# Patient Record
Sex: Male | Born: 1985 | Hispanic: Yes | Marital: Single
Health system: Southern US, Community
[De-identification: ages and names within clinical notes are randomized; demographics above are authoritative.]

## PROBLEM LIST (undated history)

## (undated) DIAGNOSIS — S069XAA Unspecified intracranial injury with loss of consciousness status unknown, initial encounter: Secondary | ICD-10-CM

## (undated) DIAGNOSIS — S069X9A Unspecified intracranial injury with loss of consciousness of unspecified duration, initial encounter: Secondary | ICD-10-CM

---

## 2016-05-17 ENCOUNTER — Emergency Department (HOSPITAL_COMMUNITY): Payer: Non-veteran care

## 2016-05-17 ENCOUNTER — Encounter (HOSPITAL_COMMUNITY): Payer: Self-pay | Admitting: Emergency Medicine

## 2016-05-17 ENCOUNTER — Emergency Department (HOSPITAL_COMMUNITY)
Admission: EM | Admit: 2016-05-17 | Discharge: 2016-05-17 | Disposition: A | Discharge status: Home / Self Care | Payer: Non-veteran care | Attending: Emergency Medicine | Admitting: Emergency Medicine

## 2016-05-17 DIAGNOSIS — T402X5A Adverse effect of other opioids, initial encounter: Secondary | ICD-10-CM | POA: Diagnosis not present

## 2016-05-17 DIAGNOSIS — R4182 Altered mental status, unspecified: Secondary | ICD-10-CM | POA: Insufficient documentation

## 2016-05-17 DIAGNOSIS — Y9301 Activity, walking, marching and hiking: Secondary | ICD-10-CM | POA: Diagnosis not present

## 2016-05-17 DIAGNOSIS — M545 Low back pain, unspecified: Secondary | ICD-10-CM

## 2016-05-17 DIAGNOSIS — Y9289 Other specified places as the place of occurrence of the external cause: Secondary | ICD-10-CM | POA: Diagnosis not present

## 2016-05-17 DIAGNOSIS — W19XXXA Unspecified fall, initial encounter: Secondary | ICD-10-CM | POA: Insufficient documentation

## 2016-05-17 DIAGNOSIS — Y999 Unspecified external cause status: Secondary | ICD-10-CM | POA: Insufficient documentation

## 2016-05-17 DIAGNOSIS — Z5181 Encounter for therapeutic drug level monitoring: Secondary | ICD-10-CM | POA: Insufficient documentation

## 2016-05-17 DIAGNOSIS — R531 Weakness: Secondary | ICD-10-CM | POA: Diagnosis present

## 2016-05-17 DIAGNOSIS — T50905A Adverse effect of unspecified drugs, medicaments and biological substances, initial encounter: Secondary | ICD-10-CM

## 2016-05-17 HISTORY — DX: Unspecified intracranial injury with loss of consciousness status unknown, initial encounter: S06.9XAA

## 2016-05-17 HISTORY — DX: Unspecified intracranial injury with loss of consciousness of unspecified duration, initial encounter: S06.9X9A

## 2016-05-17 LAB — ABO/RH: ABO/RH(D): O POS

## 2016-05-17 LAB — CBC WITH DIFFERENTIAL/PLATELET
Basophils Absolute: 0 10*3/uL (ref 0.0–0.1)
Basophils Relative: 0 %
EOS PCT: 0 %
Eosinophils Absolute: 0 10*3/uL (ref 0.0–0.7)
HCT: 42.2 % (ref 39.0–52.0)
Hemoglobin: 14.5 g/dL (ref 13.0–17.0)
LYMPHS ABS: 2 10*3/uL (ref 0.7–4.0)
LYMPHS PCT: 20 %
MCH: 30.9 pg (ref 26.0–34.0)
MCHC: 34.4 g/dL (ref 30.0–36.0)
MCV: 89.8 fL (ref 78.0–100.0)
MONOS PCT: 10 %
Monocytes Absolute: 1 10*3/uL (ref 0.1–1.0)
Neutro Abs: 6.9 10*3/uL (ref 1.7–7.7)
Neutrophils Relative %: 70 %
PLATELETS: 249 10*3/uL (ref 150–400)
RBC: 4.7 MIL/uL (ref 4.22–5.81)
RDW: 13 % (ref 11.5–15.5)
WBC: 9.9 10*3/uL (ref 4.0–10.5)

## 2016-05-17 LAB — COMPREHENSIVE METABOLIC PANEL
ALBUMIN: 3.9 g/dL (ref 3.5–5.0)
ALT: 25 U/L (ref 17–63)
AST: 44 U/L — AB (ref 15–41)
Alkaline Phosphatase: 50 U/L (ref 38–126)
Anion gap: 7 (ref 5–15)
BUN: 8 mg/dL (ref 6–20)
CHLORIDE: 104 mmol/L (ref 101–111)
CO2: 30 mmol/L (ref 22–32)
CREATININE: 0.87 mg/dL (ref 0.61–1.24)
Calcium: 9 mg/dL (ref 8.9–10.3)
GFR calc Af Amer: >60 mL/min (ref >60)
GLUCOSE: 92 mg/dL (ref 65–99)
POTASSIUM: 4.2 mmol/L (ref 3.5–5.1)
SODIUM: 141 mmol/L (ref 135–145)
Total Bilirubin: 0.8 mg/dL (ref 0.3–1.2)
Total Protein: 6.8 g/dL (ref 6.5–8.1)

## 2016-05-17 LAB — I-STAT ARTERIAL BLOOD GAS, ED
ACID-BASE EXCESS: 3 mmol/L — AB (ref 0.0–2.0)
BICARBONATE: 28.3 mmol/L — AB (ref 20.0–28.0)
O2 SAT: 97 %
PH ART: 7.418 (ref 7.350–7.450)
PO2 ART: 88 mmHg (ref 83.0–108.0)
Patient temperature: 98.6
TCO2: 30 mmol/L (ref 0–100)
pCO2 arterial: 43.8 mmHg (ref 32.0–48.0)

## 2016-05-17 LAB — AMMONIA: Ammonia: 40 umol/L — ABNORMAL HIGH (ref 9–35)

## 2016-05-17 LAB — ETHANOL

## 2016-05-17 LAB — TYPE AND SCREEN
ABO/RH(D): O POS
ANTIBODY SCREEN: NEGATIVE

## 2016-05-17 LAB — CBG MONITORING, ED: GLUCOSE-CAPILLARY: 99 mg/dL (ref 65–99)

## 2016-05-17 LAB — APTT: APTT: 33 s (ref 24–36)

## 2016-05-17 LAB — PROTIME-INR
INR: 0.97
Prothrombin Time: 12.9 seconds (ref 11.4–15.2)

## 2016-05-17 MED ORDER — SODIUM CHLORIDE 0.9 % IV SOLN
INTRAVENOUS | Status: DC
  Administered 2016-05-17: 16:00:00 via INTRAVENOUS

## 2016-05-17 MED ORDER — NALOXONE HCL 0.4 MG/ML IJ SOLN
0.4000 mg | INTRAMUSCULAR | Status: DC | PRN
  Administered 2016-05-17: 0.4 mg via INTRAVENOUS
  Filled 2016-05-17: qty 1

## 2016-05-17 NOTE — ED Triage Notes (Signed)
Pt arrives via ems [place] war vet with TBI,  per them he woke up on floor because he was sleep walking and now his legs are weak, arrives wants out of c colller and head blocks , he took them off before Josh could get in here,  Became upset and stood up unsteady on feet and stiff trying to walk, states vomited a lot last night.  unk when it started

## 2016-05-17 NOTE — ED Notes (Signed)
Pt states he needs crutches, ortho contacted because we are out of his size

## 2016-05-17 NOTE — ED Notes (Addendum)
Tried to talk pt into staying he is very unsteady on feet and sleepy, he wants to go out and smoke and then do brain xray offered a nicotine patch and he  Refused,  Security called, pt put on shoes and tried to walk again very unsteady, stating he wants to leave,, asked if his mother was coming since she called while he was here, pt states she lives out of state. Pt  Got in w/c which was offered and rolled out door by security, I had told him I could go out with him so he could smoke and that if he wanted to leave he could, he states no one can hold him and he wants to leave, witnessed by pa  Josh today.

## 2016-05-17 NOTE — Discharge Instructions (Signed)
Laboratory tests and x-rays were reassuring.Follow-up with your primary care doctor. Be careful about taking the Lortab medication. This  contributed to the drowsiness here in the emergency room.

## 2016-05-17 NOTE — ED Notes (Signed)
Pt returned from ct and placed on monitor 

## 2016-05-17 NOTE — ED Notes (Signed)
This rn went in to assess patient, the patient was unresponsive, would open eye to voice and would not follow any commands, oxygen 85% on RA, er md notified, 0.4 mg of narcan given, patient is now alert and oriented, denies any pain at this time, breathing well on his own, no apparent distress

## 2016-05-17 NOTE — ED Provider Notes (Signed)
2:45 PM I was called to room by nurse, new patient brought in by EMS.   He was evaluated briefly by myself and decided that he no longer wanted to be seen. I explained that given his issues ambulating, we strongly suggest he stay for evaluation with blood work and imaging. Both myself and the nurse attempted to explain why this is important, that he may have a brain injury or stroke, he could have an electrolyte imbalance, or other problem that could get worse if he leaves. Patient was able to repeat consequences of leaving back to me and appears, in my my judgement, to have capacity to make medical decisions on his behalf. He was invited to return if he decides that he wants evaluation. He does not want to stay, even for a partial evaluation with CT only.   Patient demanded to leave immediately. Patient was wheeled to waiting room by security.    Renne CriglerJoshua Maryclaire Stoecker, PA-C 05/17/16 1521    Cathren LaineKevin Steinl, MD 05/20/16 [id number]

## 2016-05-17 NOTE — Progress Notes (Signed)
Orthopedic Tech Progress Note Patient Details:  Jannet AskewMichael Florentino 1986-07-04 [id number]  Ortho Devices Type of Ortho Device: Crutches Ortho Device/Splint Interventions: Application   Saul FordyceJennifer C Karsten Howry 05/17/2016, 6:21 PM

## 2016-05-17 NOTE — ED Notes (Signed)
Pt left without signing, stated understood physician dc instructions

## 2016-05-17 NOTE — ED Triage Notes (Signed)
[place] [employer], [employer] vet with TBI woke up on floor this am c/o bilateral lower extremity weakness and decreased  sensory ,pt in full c spine precautions due to decreased sesation

## 2016-05-17 NOTE — ED Provider Notes (Signed)
[facility]-EMERGENCY DEPT Provider Note   CSN: [id number] Arrival date & time: 05/17/16  1425     History   Chief Complaint Chief Complaint  Patient presents with  . Fall  . Weakness    HPI Jimmy AskewMichael Meyer is a 30 y.o. male.  HPI Pt was brought to the ED for evaluation by EMS.  Pt has a history of TBI.  His legs felt weak last night.  He woke up lying on the floor.   Pt also complained of vomiting last night.    EMS was called and pt was brought in on a backboard.  After arriving here, pt decided he was going to leave AMA.  Pt was starting to leave and was screened by another provider.   He changed his mind and decided to stay in the ED.   The patient is very somnolent currently. He opens his eyes when I speak to him but does not answer my questions or provide any history. Past Medical History:  Diagnosis Date  . TBI (traumatic brain injury) (HCC)     There are no active problems to display for this patient.   No past surgical history on file.     Home Medications    Prior to Admission medications   Not on File    Family History No family history on file.  Social History Social History  Substance Use Topics  . Smoking status: Not on file  . Smokeless tobacco: Not on file  . Alcohol use Not on file     Allergies   Motrin [ibuprofen]   Review of Systems Review of Systems  Unable to perform ROS: Mental status change  All other systems reviewed and are negative.    Physical Exam Updated Vital Signs BP 114/63   Pulse (!) 56   Temp 97.9 F (36.6 C) (Oral)   Resp 12   SpO2 94%   Physical Exam  Constitutional: He appears well-developed and well-nourished. He appears lethargic. No distress.  HENT:  Head: Normocephalic and atraumatic.  Right Ear: External ear normal.  Left Ear: External ear normal.  No external signs of trauma  Eyes: Conjunctivae are normal. Right eye exhibits no discharge. Left eye exhibits no discharge. No scleral icterus.  Pupils are  approximately 1-2 mm bilaterally  Neck: Neck supple. No tracheal deviation present.  Cardiovascular: Normal rate, regular rhythm and intact distal pulses.   Pulmonary/Chest: Effort normal and breath sounds normal. No stridor. No respiratory distress. He has no wheezes. He has no rales.  Abdominal: Soft. Bowel sounds are normal. He exhibits no distension. There is no tenderness. There is no rebound and no guarding.  Musculoskeletal: He exhibits no edema or tenderness.  Neurological: He appears lethargic. No cranial nerve deficit (no facial droop, extraocular movements intact, no slurred speech) or sensory deficit. He exhibits normal muscle tone. He displays no seizure activity. Coordination abnormal. GCS eye subscore is 3. GCS verbal subscore is 2. GCS motor subscore is 6.  Pt is sleeping, he will wake up and open eyes when spoken to, does not answer questions and quickly closes eyes again, he does follow some commands and squeezes my hand when I asked him  Skin: Skin is warm and dry. No rash noted.  Psychiatric: He has a normal mood and affect.  Nursing note and vitals reviewed.    ED Treatments / Results  Labs (all labs ordered are listed, but only abnormal results are displayed) Labs Reviewed  COMPREHENSIVE METABOLIC PANEL - Abnormal; Notable  for the following:       Result Value   AST 44 (*)    All other components within normal limits  AMMONIA - Abnormal; Notable for the following:    Ammonia 40 (*)    All other components within normal limits  I-STAT ARTERIAL BLOOD GAS, ED - Abnormal; Notable for the following:    Bicarbonate 28.3 (*)    Acid-Base Excess 3.0 (*)    All other components within normal limits  APTT  CBC WITH DIFFERENTIAL/PLATELET  PROTIME-INR  ETHANOL  RAPID URINE DRUG SCREEN, HOSP PERFORMED  CBG MONITORING, ED  TYPE AND SCREEN  ABO/RH    EKG  EKG Interpretation  Date/Time:  Tuesday May 17 2016 15:51:09 EDT Ventricular Rate:  60 PR Interval:    QRS  Duration: 77 QT Interval:  420 QTC Calculation: 420 R Axis:   72 Text Interpretation:  Sinus rhythm No old tracing to compare Confirmed by Asha Grumbine  MD-J, Lamine Laton ([id number]) on 05/17/2016 3:52:53 PM       Radiology Dg Chest 1 View  Result Date: 05/17/2016 CLINICAL DATA:  Pain following fall EXAM: CHEST 1 VIEW COMPARISON:  None. FINDINGS: Lungs are clear. Heart size and pulmonary vascularity are normal. No adenopathy. No pneumothorax. No bone lesions. IMPRESSION: No abnormality noted. Electronically Signed   By: Bretta Bang III M.D.   On: 05/17/2016 16:55   Dg Lumbar Spine Complete  Result Date: 05/17/2016 CLINICAL DATA:  Pain following fall.  Lower extremity weakness EXAM: LUMBAR SPINE - COMPLETE 4+ VIEW COMPARISON:  None. FINDINGS: Frontal, lateral, spot lumbosacral lateral, and bilateral oblique views were obtained. There are 5 non-rib-bearing lumbar type vertebral bodies. There is no fracture or spondylolisthesis. The disc spaces appear normal. There is no appreciable facet arthropathy. IMPRESSION: No fracture or spondylolisthesis.  No evident arthropathic change. Electronically Signed   By: Bretta Bang III M.D.   On: 05/17/2016 16:57   Ct Head Wo Contrast  Result Date: 05/17/2016 CLINICAL DATA:  30 year old male with headache, neck pain and bilateral lower extremity weakness with fall today. EXAM: CT HEAD WITHOUT CONTRAST CT CERVICAL SPINE WITHOUT CONTRAST TECHNIQUE: Multidetector CT imaging of the head and cervical spine was performed following the standard protocol without intravenous contrast. Multiplanar CT image reconstructions of the cervical spine were also generated. COMPARISON:  None. FINDINGS: CT HEAD FINDINGS Brain: No evidence of infarction, hemorrhage, hydrocephalus, extra-axial collection or mass lesion/mass effect. Vascular: No hyperdense vessel or unexpected calcification. Skull: Normal. Negative for fracture or focal lesion. Sinuses/Orbits: No acute finding. Other: None  CT CERVICAL SPINE FINDINGS Alignment: Normal. Skull base and vertebrae: No acute fracture. No primary bone lesion or focal pathologic process. Soft tissues and spinal canal: No prevertebral fluid or swelling. No visible canal hematoma. Disc levels:  Unremarkable Upper chest: Negative. Other: None IMPRESSION: Unremarkable noncontrast head and cervical spine CTs. Electronically Signed   By: Harmon Pier M.D.   On: 05/17/2016 16:36   Ct Cervical Spine Wo Contrast  Result Date: 05/17/2016 CLINICAL DATA:  30 year old male with headache, neck pain and bilateral lower extremity weakness with fall today. EXAM: CT HEAD WITHOUT CONTRAST CT CERVICAL SPINE WITHOUT CONTRAST TECHNIQUE: Multidetector CT imaging of the head and cervical spine was performed following the standard protocol without intravenous contrast. Multiplanar CT image reconstructions of the cervical spine were also generated. COMPARISON:  None. FINDINGS: CT HEAD FINDINGS Brain: No evidence of infarction, hemorrhage, hydrocephalus, extra-axial collection or mass lesion/mass effect. Vascular: No hyperdense vessel or unexpected calcification. Skull: Normal.  Negative for fracture or focal lesion. Sinuses/Orbits: No acute finding. Other: None CT CERVICAL SPINE FINDINGS Alignment: Normal. Skull base and vertebrae: No acute fracture. No primary bone lesion or focal pathologic process. Soft tissues and spinal canal: No prevertebral fluid or swelling. No visible canal hematoma. Disc levels:  Unremarkable Upper chest: Negative. Other: None IMPRESSION: Unremarkable noncontrast head and cervical spine CTs. Electronically Signed   By: Harmon Pier M.D.   On: 05/17/2016 16:36    Procedures Procedures (including critical care time)  Medications Ordered in ED Medications  0.9 %  sodium chloride infusion ( Intravenous New Bag/Given 05/17/16 1556)  naloxone (NARCAN) injection 0.4 mg (0.4 mg Intravenous Given 05/17/16 1553)     Initial Impression / Assessment and  Plan / ED Course  I have reviewed the triage vital signs and the nursing notes.  Pertinent labs & imaging results that were available during my care of the patient were reviewed by me and considered in my medical decision making (see chart for details).  Clinical Course  Comment By Time  After the dose of narcan pt is wide awake answering questions.  He states he is here because of pain in his back and legs.  Repeat neuro exam.  Alert and oriented.  5/5 plantar and dorsiflexion.  Nl reflexes bilaterally. Linwood Dibbles, MD 10/17 1623   Pt has remained stable.  Lethargy related to opiate use.  Pt denies any overdose.  Pt presented with back pain complaints.  No acute findings on exam.  Neuro exam is normal.  Stable for discharge.  Cautioned patient about opiate use.  Follow up with PCP.   Final Clinical Impressions(s) / ED Diagnoses   Final diagnoses:  Adverse effect of drug, initial encounter  Low back pain without sciatica, unspecified back pain laterality, unspecified chronicity    New Prescriptions New Prescriptions   No medications on file     Linwood Dibbles, MD 05/17/16 1807

## 2016-05-17 NOTE — ED Notes (Signed)
Pt is back from outside with security states he will be seen now

## 2016-05-17 NOTE — ED Notes (Signed)
Attempted to obtain urine specimen; Pt unable to provide one at this time, states that he does not have to go right now; will try to obtain sample again in 30 mintues

## 2016-05-17 NOTE — ED Notes (Signed)
Patient admits to taking norco

## 2018-06-08 IMAGING — CT CT HEAD W/O CM
4 of 8 series · 19 of 47 positions shown, 21 images · non-contrast
Comparison: None.

CLINICAL DATA: 29-year-old male with headache, neck pain and
bilateral lower extremity weakness with fall today.

EXAM:
CT HEAD WITHOUT CONTRAST
CT CERVICAL SPINE WITHOUT CONTRAST
TECHNIQUE: Multidetector CT imaging of the head and cervical spine was
performed following the standard protocol without intravenous
contrast. Multiplanar CT image reconstructions of the cervical spine
were also generated.

[Series 302: soft tissue, idose (2) · axial · 0.39mm/px · z∈[+31,+189]mm · 8 of 103 slices shown, 10 images]
[im 12/103  brain]
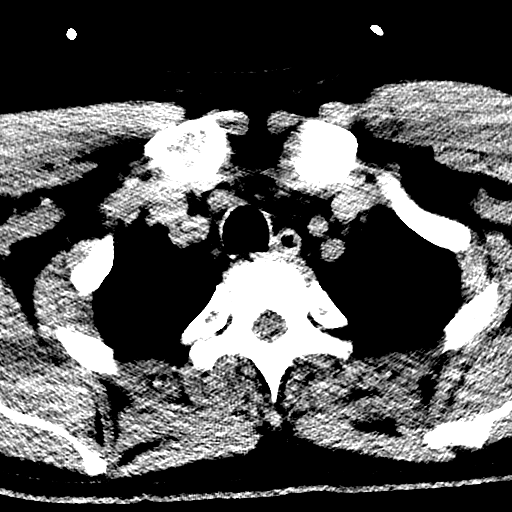
[im 12/103  bone]
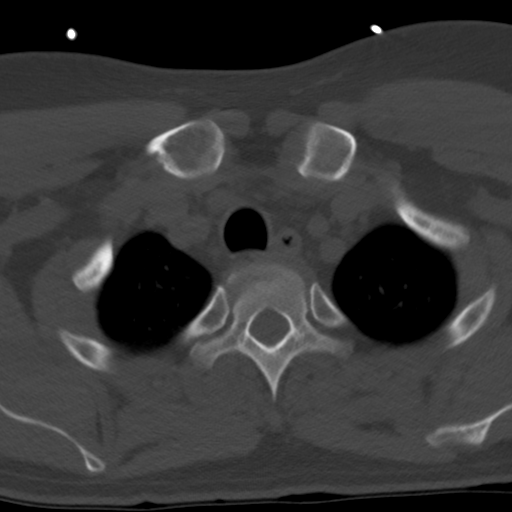
[im 23/103  brain]
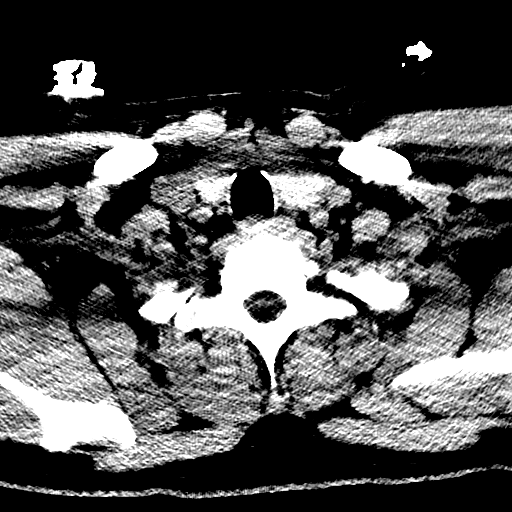
[im 35/103  brain]
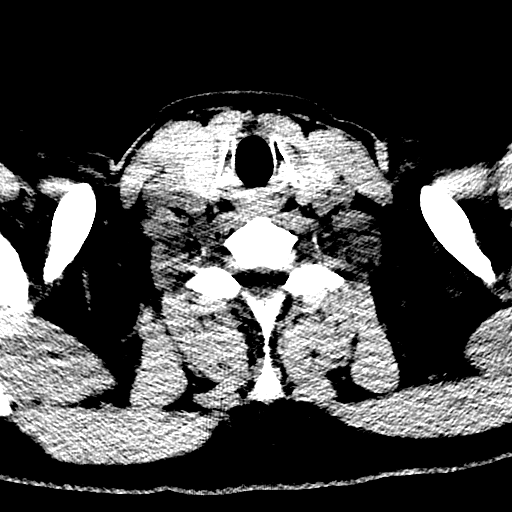
[im 46/103  brain]
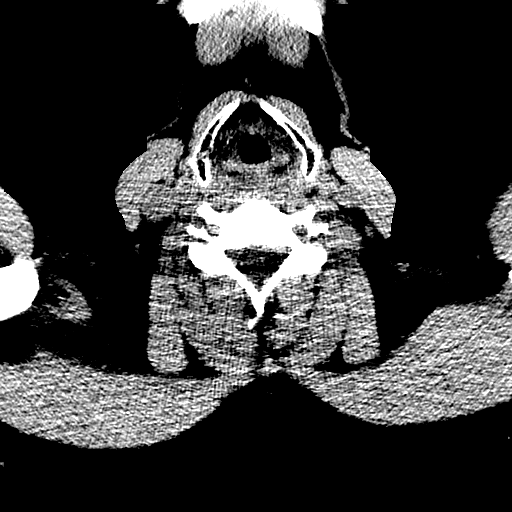
[im 57/103  brain]
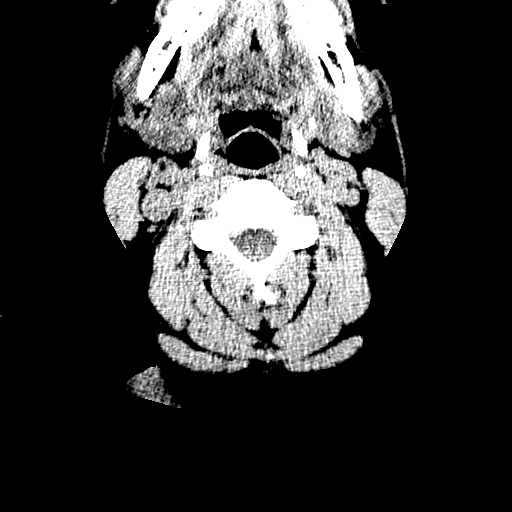
[im 57/103  bone]
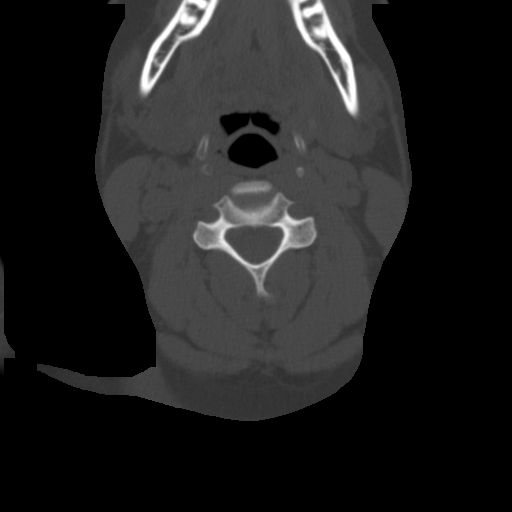
[im 69/103  brain]
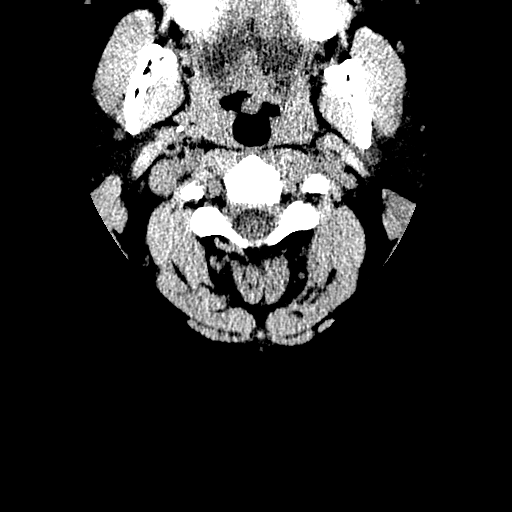
[im 80/103  brain]
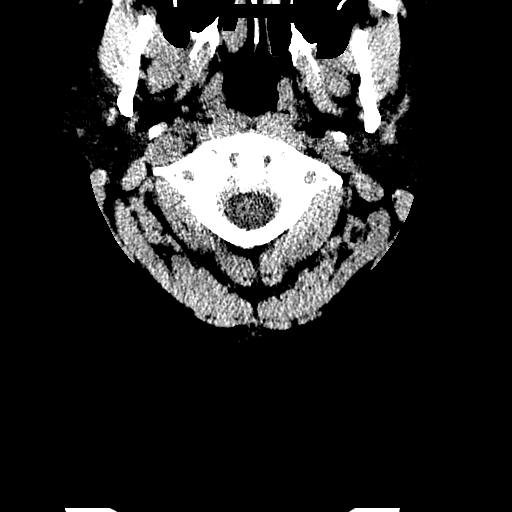
[im 91/103  brain]
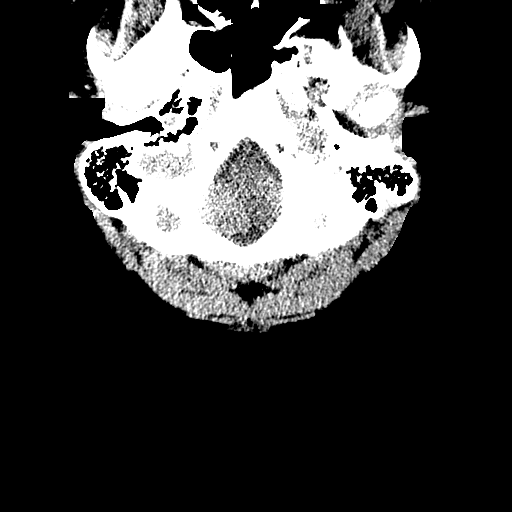

[Series 304: coronal, idose (2) · coronal · 0.36mm/px · 2 of 100 slices shown]
[im 62/100  brain]
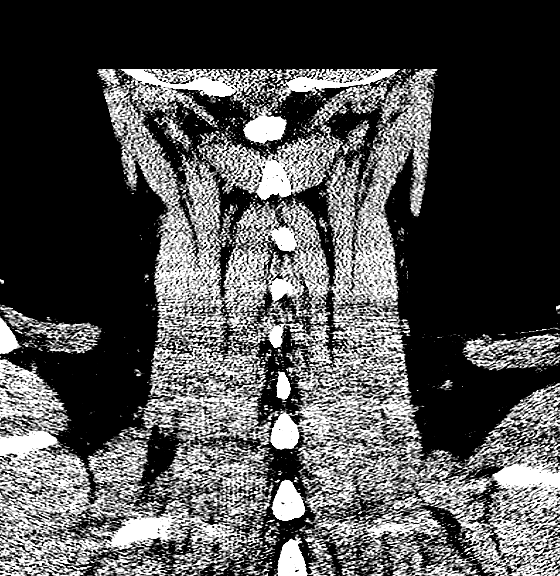
[im 81/100  brain]
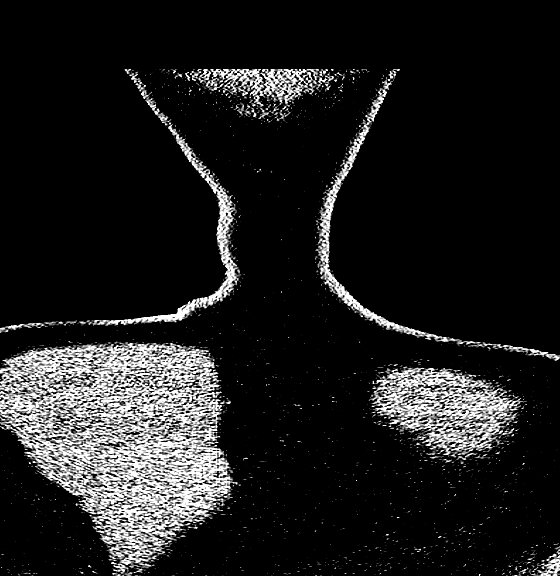

[Series 305: sagittal, idose (2) · sagittal · 0.34mm/px · 2 of 100 slices shown]
[im 34/100  brain]
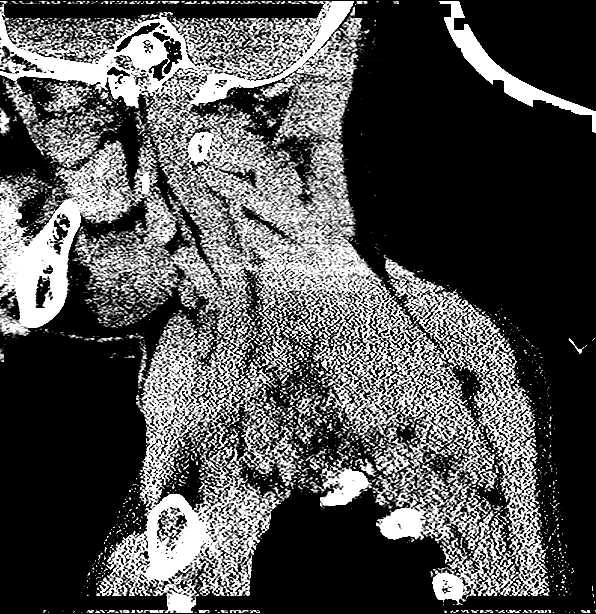
[im 67/100  brain]
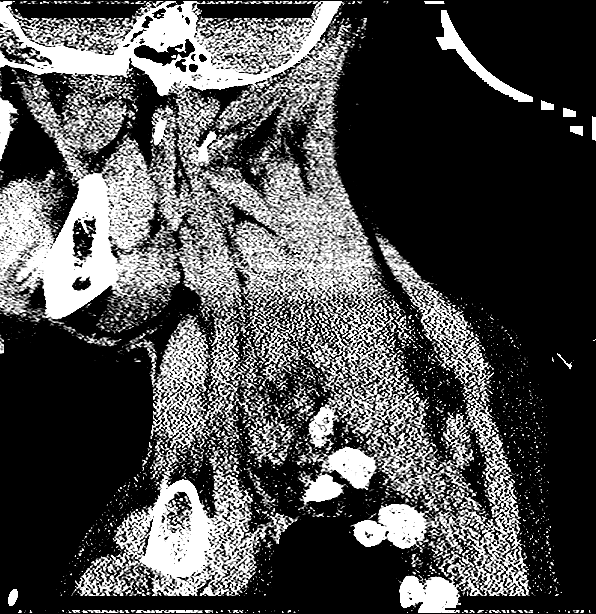

[Series 306: orthogonals, idose (2) · axial · 0.51mm/px · z∈[+1,+129]mm · 7 of 103 slices shown]
[im 12/103  brain]
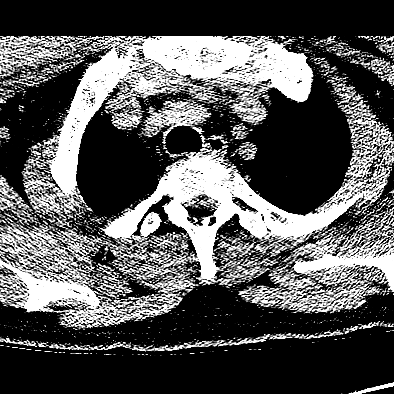
[im 23/103  brain]
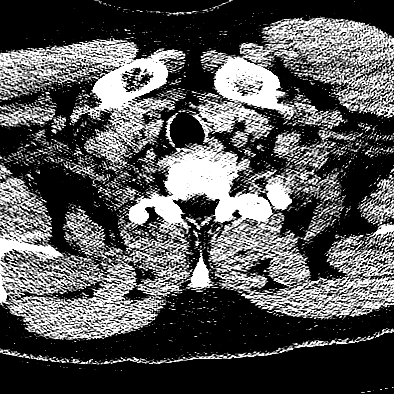
[im 35/103  brain]
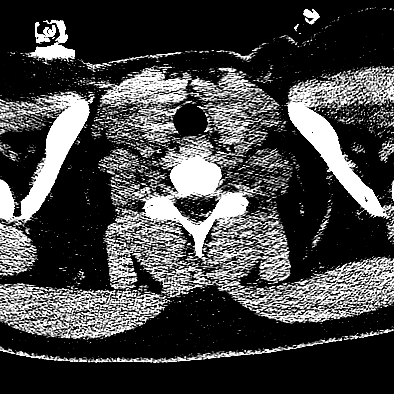
[im 46/103  brain]
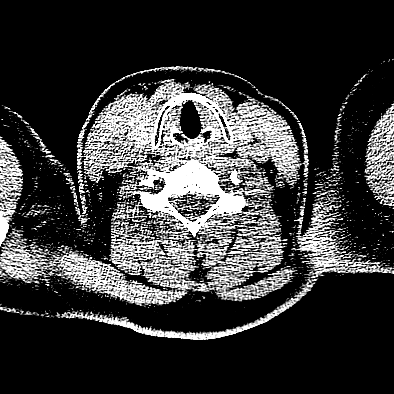
[im 57/103  brain]
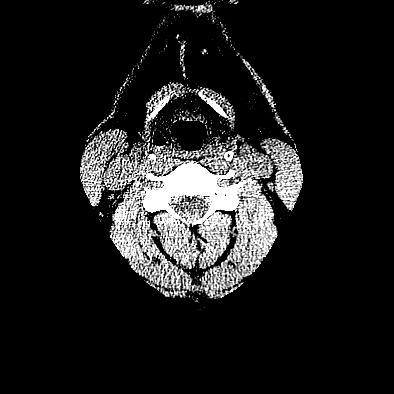
[im 69/103  brain]
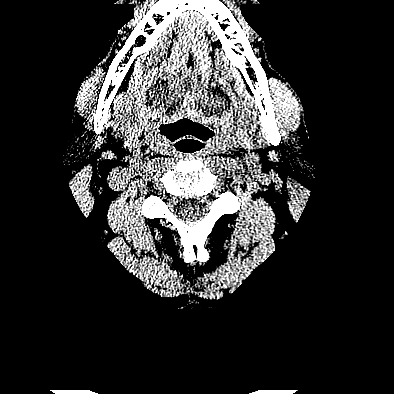
[im 80/103  brain]
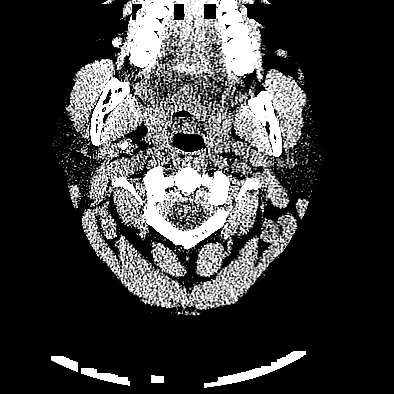

[19 of 47 positions shown; findings below may reference images not displayed]

FINDINGS: CT HEAD FINDINGS

Brain: No evidence of infarction, hemorrhage, hydrocephalus,
extra-axial collection or mass lesion/mass effect.

Vascular: No hyperdense vessel or unexpected calcification.

Skull: Normal. Negative for fracture or focal lesion.

Sinuses/Orbits: No acute finding.

Other: None

CT CERVICAL SPINE FINDINGS

Alignment: Normal.

Skull base and vertebrae: No acute fracture. No primary bone lesion
or focal pathologic process.

Soft tissues and spinal canal: No prevertebral fluid or swelling. No
visible canal hematoma.

Disc levels:  Unremarkable

Upper chest: Negative.

Other: None
IMPRESSION: Unremarkable noncontrast head and cervical spine CTs.
